# Patient Record
Sex: Female | Born: 1960 | Race: White | Hispanic: No | Marital: Married
Health system: Southern US, Community
[De-identification: ages and names within clinical notes are randomized; demographics above are authoritative.]

## PROBLEM LIST (undated history)

## (undated) DIAGNOSIS — J189 Pneumonia, unspecified organism: Secondary | ICD-10-CM

## (undated) DIAGNOSIS — J80 Acute respiratory distress syndrome: Secondary | ICD-10-CM

## (undated) DIAGNOSIS — U071 COVID-19: Secondary | ICD-10-CM

## (undated) DIAGNOSIS — J9621 Acute and chronic respiratory failure with hypoxia: Secondary | ICD-10-CM

## (undated) DIAGNOSIS — I5032 Chronic diastolic (congestive) heart failure: Secondary | ICD-10-CM

---

## 2019-06-25 ENCOUNTER — Inpatient Hospital Stay
Admission: EM | Admit: 2019-06-25 | Discharge: 2019-07-09 | Disposition: A | Discharge status: Home / Self Care | Payer: BC Managed Care – PPO | Attending: Internal Medicine | Admitting: Internal Medicine

## 2019-06-25 ENCOUNTER — Other Ambulatory Visit (HOSPITAL_COMMUNITY): Payer: Self-pay

## 2019-06-25 DIAGNOSIS — J189 Pneumonia, unspecified organism: Secondary | ICD-10-CM | POA: Diagnosis present

## 2019-06-25 DIAGNOSIS — J9621 Acute and chronic respiratory failure with hypoxia: Secondary | ICD-10-CM | POA: Diagnosis present

## 2019-06-25 DIAGNOSIS — U071 COVID-19: Secondary | ICD-10-CM | POA: Diagnosis present

## 2019-06-25 DIAGNOSIS — I5032 Chronic diastolic (congestive) heart failure: Secondary | ICD-10-CM | POA: Diagnosis present

## 2019-06-25 DIAGNOSIS — J80 Acute respiratory distress syndrome: Secondary | ICD-10-CM | POA: Diagnosis present

## 2019-06-25 DIAGNOSIS — J9690 Respiratory failure, unspecified, unspecified whether with hypoxia or hypercapnia: Secondary | ICD-10-CM

## 2019-06-25 HISTORY — DX: COVID-19: U07.1

## 2019-06-25 HISTORY — DX: Morbid (severe) obesity due to excess calories: E66.01

## 2019-06-25 HISTORY — DX: Acute and chronic respiratory failure with hypoxia: J96.21

## 2019-06-25 HISTORY — DX: Acute respiratory distress syndrome: J80

## 2019-06-25 HISTORY — DX: Chronic diastolic (congestive) heart failure: I50.32

## 2019-06-25 HISTORY — DX: Pneumonia, unspecified organism: J18.9

## 2019-06-25 MED ORDER — PHENYLEPHRINE-MINERAL OIL-PET 0.25-14-74.9 % RE OINT
TOPICAL_OINTMENT | RECTAL | Status: DC
Start: ? — End: 2019-06-25

## 2019-06-25 MED ORDER — ALBUTEROL SULFATE HFA 108 (90 BASE) MCG/ACT IN AERS
8.00 | INHALATION_SPRAY | RESPIRATORY_TRACT | Status: DC
Start: ? — End: 2019-06-25

## 2019-06-25 MED ORDER — INSULIN LISPRO 100 UNIT/ML ~~LOC~~ SOLN
1.00 | SUBCUTANEOUS | Status: DC
Start: 2019-06-26 — End: 2019-06-25

## 2019-06-25 MED ORDER — ARFORMOTEROL TARTRATE 15 MCG/2ML IN NEBU
15.00 | INHALATION_SOLUTION | RESPIRATORY_TRACT | Status: DC
Start: 2019-06-25 — End: 2019-06-25

## 2019-06-25 MED ORDER — DEXTROSE 10 % IV SOLN
125.00 | INTRAVENOUS | Status: DC
Start: ? — End: 2019-06-25

## 2019-06-25 MED ORDER — LIDOCAINE 4 % EX PTCH
3.00 | MEDICATED_PATCH | CUTANEOUS | Status: DC
Start: ? — End: 2019-06-25

## 2019-06-25 MED ORDER — GENERIC EXTERNAL MEDICATION
137.00 | Status: DC
Start: 2019-06-26 — End: 2019-06-25

## 2019-06-25 MED ORDER — BENZONATATE 100 MG PO CAPS
100.00 | ORAL_CAPSULE | ORAL | Status: DC
Start: ? — End: 2019-06-25

## 2019-06-25 MED ORDER — GUAIFENESIN ER 600 MG PO TB12
600.00 | ORAL_TABLET | ORAL | Status: DC
Start: 2019-06-25 — End: 2019-06-25

## 2019-06-25 MED ORDER — BUDESONIDE 0.5 MG/2ML IN SUSP
0.50 | RESPIRATORY_TRACT | Status: DC
Start: 2019-06-25 — End: 2019-06-25

## 2019-06-25 MED ORDER — DOCUSATE SODIUM 283 MG RE ENEM
283.00 | ENEMA | RECTAL | Status: DC
Start: ? — End: 2019-06-25

## 2019-06-25 MED ORDER — SALINE NASAL SPRAY 0.65 % NA SOLN
1.00 | NASAL | Status: DC
Start: ? — End: 2019-06-25

## 2019-06-25 MED ORDER — INSULIN LISPRO 100 UNIT/ML ~~LOC~~ SOLN
2.00 | SUBCUTANEOUS | Status: DC
Start: 2019-06-26 — End: 2019-06-25

## 2019-06-25 MED ORDER — POLYETHYLENE GLYCOL 3350 17 GM/SCOOP PO POWD
17.00 | ORAL | Status: DC
Start: 2019-06-25 — End: 2019-06-25

## 2019-06-25 MED ORDER — GENERIC EXTERNAL MEDICATION
80.00 | Status: DC
Start: 2019-06-26 — End: 2019-06-25

## 2019-06-25 MED ORDER — ATORVASTATIN CALCIUM 40 MG PO TABS
40.00 | ORAL_TABLET | ORAL | Status: DC
Start: 2019-06-26 — End: 2019-06-25

## 2019-06-25 MED ORDER — ACETAMINOPHEN 325 MG PO TABS
650.00 | ORAL_TABLET | ORAL | Status: DC
Start: ? — End: 2019-06-25

## 2019-06-25 MED ORDER — ASPIRIN 81 MG PO TBEC
81.00 | DELAYED_RELEASE_TABLET | ORAL | Status: DC
Start: 2019-06-26 — End: 2019-06-25

## 2019-06-25 MED ORDER — ENOXAPARIN SODIUM 40 MG/0.4ML ~~LOC~~ SOLN
40.00 | SUBCUTANEOUS | Status: DC
Start: 2019-06-26 — End: 2019-06-25

## 2019-06-25 MED ORDER — HYDROXYZINE HCL 25 MG PO TABS
25.00 | ORAL_TABLET | ORAL | Status: DC
Start: ? — End: 2019-06-25

## 2019-06-25 MED ORDER — PANTOPRAZOLE SODIUM 40 MG PO TBEC
40.00 | DELAYED_RELEASE_TABLET | ORAL | Status: DC
Start: 2019-06-26 — End: 2019-06-25

## 2019-06-25 MED ORDER — GENERIC EXTERNAL MEDICATION
2.50 | Status: DC
Start: ? — End: 2019-06-25

## 2019-06-25 MED ORDER — GLUCOSE 40 % PO GEL
15.00 | ORAL | Status: DC
Start: ? — End: 2019-06-25

## 2019-06-25 MED ORDER — MELATONIN 3 MG PO TABS
6.00 | ORAL_TABLET | ORAL | Status: DC
Start: ? — End: 2019-06-25

## 2019-06-25 MED ORDER — SENNOSIDES-DOCUSATE SODIUM 8.6-50 MG PO TABS
2.00 | ORAL_TABLET | ORAL | Status: DC
Start: 2019-06-25 — End: 2019-06-25

## 2019-06-26 LAB — URINALYSIS, ROUTINE W REFLEX MICROSCOPIC
Bilirubin Urine: NEGATIVE
Glucose, UA: NEGATIVE mg/dL
Hgb urine dipstick: NEGATIVE
Ketones, ur: NEGATIVE mg/dL
Nitrite: NEGATIVE
Protein, ur: NEGATIVE mg/dL
Specific Gravity, Urine: 1.019 (ref 1.005–1.030)
pH: 5 (ref 5.0–8.0)

## 2019-06-26 LAB — CBC
HCT: 38.7 % (ref 36.0–46.0)
Hemoglobin: 12.4 g/dL (ref 12.0–15.0)
MCH: 31.6 pg (ref 26.0–34.0)
MCHC: 32 g/dL (ref 30.0–36.0)
MCV: 98.7 fL (ref 80.0–100.0)
Platelets: 313 10*3/uL (ref 150–400)
RBC: 3.92 MIL/uL (ref 3.87–5.11)
RDW: 15.1 % (ref 11.5–15.5)
WBC: 9.9 10*3/uL (ref 4.0–10.5)
nRBC: 0 % (ref 0.0–0.2)

## 2019-06-26 LAB — COMPREHENSIVE METABOLIC PANEL
ALT: 22 U/L (ref 0–44)
AST: 24 U/L (ref 15–41)
Albumin: 3.4 g/dL — ABNORMAL LOW (ref 3.5–5.0)
Alkaline Phosphatase: 80 U/L (ref 38–126)
Anion gap: 14 (ref 5–15)
BUN: 15 mg/dL (ref 6–20)
CO2: 32 mmol/L (ref 22–32)
Calcium: 10.7 mg/dL — ABNORMAL HIGH (ref 8.9–10.3)
Chloride: 90 mmol/L — ABNORMAL LOW (ref 98–111)
Creatinine, Ser: 0.93 mg/dL (ref 0.44–1.00)
GFR calc Af Amer: >60 mL/min (ref >60)
GFR calc non Af Amer: >60 mL/min (ref >60)
Glucose, Bld: 129 mg/dL — ABNORMAL HIGH (ref 70–99)
Potassium: 3.4 mmol/L — ABNORMAL LOW (ref 3.5–5.1)
Sodium: 136 mmol/L (ref 135–145)
Total Bilirubin: 0.9 mg/dL (ref 0.3–1.2)
Total Protein: 7.4 g/dL (ref 6.5–8.1)

## 2019-06-26 LAB — PROTIME-INR
INR: 1 (ref 0.8–1.2)
Prothrombin Time: 12.6 seconds (ref 11.4–15.2)

## 2019-06-26 LAB — APTT: aPTT: 36 seconds (ref 24–36)

## 2019-06-26 LAB — TSH: TSH: 8.53 u[IU]/mL — ABNORMAL HIGH (ref 0.350–4.500)

## 2019-06-27 ENCOUNTER — Encounter: Payer: Self-pay | Admitting: Internal Medicine

## 2019-06-27 DIAGNOSIS — J9621 Acute and chronic respiratory failure with hypoxia: Secondary | ICD-10-CM | POA: Diagnosis not present

## 2019-06-27 DIAGNOSIS — U071 COVID-19: Secondary | ICD-10-CM

## 2019-06-27 DIAGNOSIS — J80 Acute respiratory distress syndrome: Secondary | ICD-10-CM | POA: Diagnosis present

## 2019-06-27 DIAGNOSIS — I5032 Chronic diastolic (congestive) heart failure: Secondary | ICD-10-CM | POA: Diagnosis not present

## 2019-06-27 DIAGNOSIS — J189 Pneumonia, unspecified organism: Secondary | ICD-10-CM | POA: Diagnosis not present

## 2019-06-27 LAB — POTASSIUM: Potassium: 4.2 mmol/L (ref 3.5–5.1)

## 2019-06-27 NOTE — Consult Note (Signed)
[facility] [place]  Date of Service: 06/27/2019  PULMONARY CRITICAL CARE CONSULT   Kathy Torres  LTJ:[id number]  DOB: [birth year]   DOA: 06/25/2019  Referring Physician: Merton Border, MD  HPI: Kathy Torres is a 59 y.o. female seen for follow up of Acute on Chronic Respiratory Failure.  Patient has multiple medical problems including hypertension hyperlipidemia hypothyroidism morbid obesity presented to the hospital because of increasing shortness of breath was found to be COVID-19 positive initially was started on 12 L nasal cannula however had pretty quick desaturations noted.  Patient was started on remdesivir dexamethasone for full course.  Was requiring OptiFlow in the MICU but apparently did not get intubated according to the notes.  She also was diagnosed with healthcare associated pneumonia secondary to Pseudomonas which patient was treated.  Echocardiogram did not show any abnormalities.  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  Patient also had ARDS noted.  Despite not requiring intubation she has had high oxygen requirements transferred to our facility for further management and weaning.  Review of Systems:  ROS performed and is unremarkable other than noted above.  Past Medical History: Past Medical History:  Diagnosis Date  . Diverticulitis  . Hypertension  . Renal disorder  . UTI (urinary tract infection)   Social History: Social History   Socioeconomic History  . Marital status: Married  Spouse name: Not on file  . Number of children: Not on file  . Years of education: Not on file  . Highest education level: Not on file  Occupational History  . Not on file  Social Needs  . Financial resource strain: Not on file  . Food insecurity  Worry: Not on file  Inability: Not on file  . Transportation needs  Medical: Not on file  Non-medical: Not on file   Tobacco Use  . Smoking status: Never Smoker  . Smokeless tobacco: Never Used  Substance and Sexual Activity  . Alcohol use: No  . Drug use: No   Allergies: Allergies  Allergen Reactions  . Toradol [Ketorolac] Other (See Comments)  Kidney failure  . Amoxicillin Rash (ALLERGY/intolerance)  . Shrimp Rash (ALLERGY/intolerance)      Medications: Reviewed on Rounds  Physical Exam:  Vitals: Temperature 97.1 pulse 100 respiratory 24 blood pressure is 149/70 saturations 92%  Ventilator Settings on 6 L but has been needing up to 15 L on Oxymizer off the ventilator right now  . General: Comfortable at this time . Eyes: Grossly normal lids, irises & conjunctiva . ENT: grossly tongue is normal . Neck: no obvious mass . Cardiovascular: S1-S2 normal no gallop or rub . Respiratory: Coarse rhonchi noted bilaterally . Abdomen: Soft and nontender . Skin: no rash seen on limited exam . Musculoskeletal: not rigid . Psychiatric:unable to assess . Neurologic: no seizure no involuntary movements         Labs on Admission:  Basic Metabolic Panel: Recent Labs  Lab 06/26/19 0818 06/27/19 0459  NA 136  --   K 3.4* 4.2  CL 90*  --   CO2 32  --   GLUCOSE 129*  --   BUN 15  --   CREATININE 0.93  --   CALCIUM 10.7*  --     No results for input(s): PHART, PCO2ART, PO2ART, HCO3, O2SAT in the last 168 hours.  Liver Function Tests: Recent Labs  Lab 06/26/19 0818  AST 24  ALT 22  ALKPHOS 80  BILITOT 0.9  PROT 7.4  ALBUMIN 3.4*   No results for input(s): LIPASE, AMYLASE in the last 168 hours. No results for input(s): AMMONIA in the last 168 hours.  CBC: Recent Labs  Lab 06/26/19 0818  WBC 9.9  HGB 12.4  HCT 38.7  MCV 98.7  PLT 313    Cardiac Enzymes: No results for input(s): CKTOTAL, CKMB, CKMBINDEX, TROPONINI in the last 168 hours.  BNP (last 3 results) No results for input(s): BNP in the last 8760 hours.  ProBNP (last 3 results) No results for input(s): PROBNP  in the last 8760 hours.   Radiological Exams on Admission: DG Chest Port 1 View  Result Date: 06/25/2019 CLINICAL DATA:  Respiratory failure EXAM: PORTABLE CHEST 1 VIEW COMPARISON:  None. FINDINGS: Cardiomegaly. Diffuse bilateral airspace disease. Low lung volumes. No effusions or acute bony abnormality. IMPRESSION: Diffuse bilateral airspace disease with low volumes. This could reflect edema or infection. Electronically Signed   By: Charlett Nose M.D.   On: 06/25/2019 21:23    Assessment/Plan Active Problems:   Acute on chronic respiratory failure with hypoxia (HCC)   COVID-19 virus infection   Acute respiratory distress syndrome (ARDS) due to COVID-19 virus (HCC)   Healthcare-associated pneumonia   Chronic diastolic heart failure (HCC)   Morbid obesity due to excess calories (HCC)   1. Acute on chronic respiratory failure with hypoxia as she does have residual damage from the COVID-19 with diffuse bilateral airspace disease and low lung volumes.  The patient is now on oxygen however has been requiring higher flow rates from 6 to 15 L.  We will try to wean this down. 2. COVID-19 virus infection in resolution phase we will continue to monitor 3. ARDS secondary to COVID-19 patient has had slow improvement still requiring significant elevation in oxygen requirements. 4. Healthcare associated pneumonia this has been treated we will continue to monitor closely. 5. Morbid obesity dietary management 6. Diastolic heart failure with preserved ejection fraction we will continue with supportive care monitor fluid status rather closely.  I have personally seen and evaluated the patient, evaluated laboratory and imaging results, formulated the assessment and plan and placed orders. The Patient requires high complexity decision making with multiple systems involvement.  Case was discussed on Rounds with the Respiratory Therapy Director and the Respiratory staff Time Spent  Yevonne Pax, MD  [facility] Pulmonary Critical Care Medicine Sleep Medicine

## 2019-06-28 DIAGNOSIS — J9621 Acute and chronic respiratory failure with hypoxia: Secondary | ICD-10-CM | POA: Diagnosis not present

## 2019-06-28 DIAGNOSIS — I5032 Chronic diastolic (congestive) heart failure: Secondary | ICD-10-CM | POA: Diagnosis not present

## 2019-06-28 DIAGNOSIS — U071 COVID-19: Secondary | ICD-10-CM | POA: Diagnosis not present

## 2019-06-28 DIAGNOSIS — J189 Pneumonia, unspecified organism: Secondary | ICD-10-CM | POA: Diagnosis not present

## 2019-06-28 LAB — URINE CULTURE: Culture: >=100000 — AB

## 2019-06-28 NOTE — Progress Notes (Addendum)
Pulmonary Critical Care Medicine [facility] [place]   PULMONARY CRITICAL CARE SERVICE  PROGRESS NOTE  Date of Service: 06/28/2019  Kathy Torres  [id number]  DOB: 1960/11/01   DOA: 06/25/2019  Referring Physician: Carron Curie, MD  HPI: Kathy Torres is a 59 y.o. female seen for follow up of Acute on Chronic Respiratory Failure.  Patient remains on 7 L Oxymizer satting well no distress  Medications: Reviewed on Rounds  Physical Exam:  Vitals: Pulse 72 respirations 20 BP 128/71 O2 sat 99% temp 97.2  Ventilator Settings 7 L Oxymizer  . General: Comfortable at this time . Eyes: Grossly normal lids, irises & conjunctiva . ENT: grossly tongue is normal . Neck: no obvious mass . Cardiovascular: S1 S2 normal no gallop . Respiratory: No rales rhonchi noted . Abdomen: soft . Skin: no rash seen on limited exam . Musculoskeletal: not rigid . Psychiatric:unable to assess . Neurologic: no seizure no involuntary movements         Lab Data:   Basic Metabolic Panel: Recent Labs  Lab 06/26/19 0818 06/27/19 0459  NA 136  --   K 3.4* 4.2  CL 90*  --   CO2 32  --   GLUCOSE 129*  --   BUN 15  --   CREATININE 0.93  --   CALCIUM 10.7*  --     ABG: No results for input(s): PHART, PCO2ART, PO2ART, HCO3, O2SAT in the last 168 hours.  Liver Function Tests: Recent Labs  Lab 06/26/19 0818  AST 24  ALT 22  ALKPHOS 80  BILITOT 0.9  PROT 7.4  ALBUMIN 3.4*   No results for input(s): LIPASE, AMYLASE in the last 168 hours. No results for input(s): AMMONIA in the last 168 hours.  CBC: Recent Labs  Lab 06/26/19 0818  WBC 9.9  HGB 12.4  HCT 38.7  MCV 98.7  PLT 313    Cardiac Enzymes: No results for input(s): CKTOTAL, CKMB, CKMBINDEX, TROPONINI in the last 168 hours.  BNP (last 3 results) No results for input(s): BNP in the last 8760 hours.  ProBNP (last 3 results) No results for input(s): PROBNP in the last 8760 hours.  Radiological Exams: No results  found.  Assessment/Plan Active Problems:   Acute on chronic respiratory failure with hypoxia (HCC)   COVID-19 virus infection   Acute respiratory distress syndrome (ARDS) due to COVID-19 virus (HCC)   Healthcare-associated pneumonia   Chronic diastolic heart failure (HCC)   Morbid obesity due to excess calories (HCC)   1. Acute on chronic respiratory failure with hypoxia patient continues on 7 L Oxymizer unable to wean any further at this time satting well continue aggressive pulmonary toilet supportive measures. 2. COVID-19 virus infection in resolution phase we will continue to monitor 3. ARDS secondary to COVID-19 patient has had slow improvement still requiring significant elevation in oxygen requirements. 4. Healthcare associated pneumonia this has been treated we will continue to monitor closely. 5. Morbid obesity dietary management 6. Diastolic heart failure with preserved ejection fraction we will continue with supportive care monitor fluid status rather closely.   I have personally seen and evaluated the patient, evaluated laboratory and imaging results, formulated the assessment and plan and placed orders. The Patient requires high complexity decision making with multiple systems involvement.  Rounds were done with the Respiratory Therapy Director and Staff therapists and discussed with nursing staff also.  Yevonne Pax, MD [facility] Pulmonary Critical Care Medicine Sleep Medicine

## 2019-06-29 DIAGNOSIS — I5032 Chronic diastolic (congestive) heart failure: Secondary | ICD-10-CM | POA: Diagnosis not present

## 2019-06-29 DIAGNOSIS — J189 Pneumonia, unspecified organism: Secondary | ICD-10-CM | POA: Diagnosis not present

## 2019-06-29 DIAGNOSIS — J9621 Acute and chronic respiratory failure with hypoxia: Secondary | ICD-10-CM | POA: Diagnosis not present

## 2019-06-29 DIAGNOSIS — U071 COVID-19: Secondary | ICD-10-CM | POA: Diagnosis not present

## 2019-06-29 LAB — URINE CULTURE: Culture: >=100000 — AB

## 2019-06-29 NOTE — Progress Notes (Addendum)
Pulmonary Critical Care Medicine [facility] [place]   PULMONARY CRITICAL CARE SERVICE  PROGRESS NOTE  Date of Service: 06/29/2019  Kathy Torres  [id number]  DOB: 02/17/1961   DOA: 06/25/2019  Referring Physician: Carron Curie, MD  HPI: Kathy Torres is a 59 y.o. female seen for follow up of Acute on Chronic Respiratory Failure.  Patient Oxymizer satting well no fever or distress.  Medications: Reviewed on Rounds  Physical Exam:  Vitals: Pulse 75 respirations 18 BP 114/71 O2 sat 95% temp 97.1  Ventilator Settings 7 L Oxymizer  . General: Comfortable at this time . Eyes: Grossly normal lids, irises & conjunctiva . ENT: grossly tongue is normal . Neck: no obvious mass . Cardiovascular: S1 S2 normal no gallop . Respiratory: No rales or rhonchi noted . Abdomen: soft . Skin: no rash seen on limited exam . Musculoskeletal: not rigid . Psychiatric:unable to assess . Neurologic: no seizure no involuntary movements         Lab Data:   Basic Metabolic Panel: Recent Labs  Lab 06/26/19 0818 06/27/19 0459  NA 136  --   K 3.4* 4.2  CL 90*  --   CO2 32  --   GLUCOSE 129*  --   BUN 15  --   CREATININE 0.93  --   CALCIUM 10.7*  --     ABG: No results for input(s): PHART, PCO2ART, PO2ART, HCO3, O2SAT in the last 168 hours.  Liver Function Tests: Recent Labs  Lab 06/26/19 0818  AST 24  ALT 22  ALKPHOS 80  BILITOT 0.9  PROT 7.4  ALBUMIN 3.4*   No results for input(s): LIPASE, AMYLASE in the last 168 hours. No results for input(s): AMMONIA in the last 168 hours.  CBC: Recent Labs  Lab 06/26/19 0818  WBC 9.9  HGB 12.4  HCT 38.7  MCV 98.7  PLT 313    Cardiac Enzymes: No results for input(s): CKTOTAL, CKMB, CKMBINDEX, TROPONINI in the last 168 hours.  BNP (last 3 results) No results for input(s): BNP in the last 8760 hours.  ProBNP (last 3 results) No results for input(s): PROBNP in the last 8760 hours.  Radiological Exams: No results  found.  Assessment/Plan Active Problems:   Acute on chronic respiratory failure with hypoxia (HCC)   COVID-19 virus infection   Acute respiratory distress syndrome (ARDS) due to COVID-19 virus (HCC)   Healthcare-associated pneumonia   Chronic diastolic heart failure (HCC)   Morbid obesity due to excess calories (HCC)   1. Acute on chronic respiratory failure with hypoxia continue to wean down Oxymizer.  Currently on 7 L.  Continue supportive measures and pulmonary toilet. 2. COVID-19 virus infection in resolution phase we will continue to monitor 3. ARDS secondary to COVID-19 patient has had slow improvement still requiring significant elevation in oxygen requirements. 4. Healthcare associated pneumonia this has been treated we will continue to monitor closely. 5. Morbid obesity dietary management 6. Diastolic heart failure with preserved ejection fraction we will continue with supportive care monitor fluid status rather closely.   I have personally seen and evaluated the patient, evaluated laboratory and imaging results, formulated the assessment and plan and placed orders. The Patient requires high complexity decision making with multiple systems involvement.  Rounds were done with the Respiratory Therapy Director and Staff therapists and discussed with nursing staff also.  Yevonne Pax, MD [facility] Pulmonary Critical Care Medicine Sleep Medicine

## 2019-06-30 DIAGNOSIS — J189 Pneumonia, unspecified organism: Secondary | ICD-10-CM | POA: Diagnosis not present

## 2019-06-30 DIAGNOSIS — I5032 Chronic diastolic (congestive) heart failure: Secondary | ICD-10-CM | POA: Diagnosis not present

## 2019-06-30 DIAGNOSIS — U071 COVID-19: Secondary | ICD-10-CM | POA: Diagnosis not present

## 2019-06-30 DIAGNOSIS — J9621 Acute and chronic respiratory failure with hypoxia: Secondary | ICD-10-CM | POA: Diagnosis not present

## 2019-06-30 LAB — MAGNESIUM: Magnesium: 1.9 mg/dL (ref 1.7–2.4)

## 2019-06-30 LAB — CBC
HCT: 36.7 % (ref 36.0–46.0)
Hemoglobin: 11.9 g/dL — ABNORMAL LOW (ref 12.0–15.0)
MCH: 32.4 pg (ref 26.0–34.0)
MCHC: 32.4 g/dL (ref 30.0–36.0)
MCV: 100 fL (ref 80.0–100.0)
Platelets: 374 10*3/uL (ref 150–400)
RBC: 3.67 MIL/uL — ABNORMAL LOW (ref 3.87–5.11)
RDW: 15.1 % (ref 11.5–15.5)
WBC: 14.6 10*3/uL — ABNORMAL HIGH (ref 4.0–10.5)
nRBC: 0 % (ref 0.0–0.2)

## 2019-06-30 LAB — BASIC METABOLIC PANEL
Anion gap: 10 (ref 5–15)
BUN: 23 mg/dL — ABNORMAL HIGH (ref 6–20)
CO2: 33 mmol/L — ABNORMAL HIGH (ref 22–32)
Calcium: 10.3 mg/dL (ref 8.9–10.3)
Chloride: 94 mmol/L — ABNORMAL LOW (ref 98–111)
Creatinine, Ser: 0.89 mg/dL (ref 0.44–1.00)
GFR calc Af Amer: >60 mL/min (ref >60)
GFR calc non Af Amer: >60 mL/min (ref >60)
Glucose, Bld: 114 mg/dL — ABNORMAL HIGH (ref 70–99)
Potassium: 3.2 mmol/L — ABNORMAL LOW (ref 3.5–5.1)
Sodium: 137 mmol/L (ref 135–145)

## 2019-06-30 NOTE — Progress Notes (Addendum)
Pulmonary Critical Care Medicine [facility] [place]   PULMONARY CRITICAL CARE SERVICE  PROGRESS NOTE  Date of Service: 06/30/2019  Ketsia Linebaugh  PFX:[id number]  DOB: 22-Mar-1961   DOA: 06/25/2019  Referring Physician: Carron Curie, MD  HPI: [place] Lemaster is a 59 y.o. female seen for follow up of Acute on Chronic Respiratory Failure.  Patient is on 5 L Oxymizer satting well no fever distress at this time.  Medications: Reviewed on Rounds  Physical Exam:  Vitals: Pulse 70 respirations 18 BP 122/81 O2 sat 95% temp 96.5  Ventilator Settings 5 L Oxymizer  . General: Comfortable at this time . Eyes: Grossly normal lids, irises & conjunctiva . ENT: grossly tongue is normal . Neck: no obvious mass . Cardiovascular: S1 S2 normal no gallop . Respiratory: No rales or rhonchi noted . Abdomen: soft . Skin: no rash seen on limited exam . Musculoskeletal: not rigid . Psychiatric:unable to assess . Neurologic: no seizure no involuntary movements         Lab Data:   Basic Metabolic Panel: Recent Labs  Lab 06/26/19 0818 06/27/19 0459 06/30/19 0741  NA 136  --  137  K 3.4* 4.2 3.2*  CL 90*  --  94*  CO2 32  --  33*  GLUCOSE 129*  --  114*  BUN 15  --  23*  CREATININE 0.93  --  0.89  CALCIUM 10.7*  --  10.3  MG  --   --  1.9    ABG: No results for input(s): PHART, PCO2ART, PO2ART, HCO3, O2SAT in the last 168 hours.  Liver Function Tests: Recent Labs  Lab 06/26/19 0818  AST 24  ALT 22  ALKPHOS 80  BILITOT 0.9  PROT 7.4  ALBUMIN 3.4*   No results for input(s): LIPASE, AMYLASE in the last 168 hours. No results for input(s): AMMONIA in the last 168 hours.  CBC: Recent Labs  Lab 06/26/19 0818 06/30/19 0741  WBC 9.9 14.6*  HGB 12.4 11.9*  HCT 38.7 36.7  MCV 98.7 100.0  PLT 313 374    Cardiac Enzymes: No results for input(s): CKTOTAL, CKMB, CKMBINDEX, TROPONINI in the last 168 hours.  BNP (last 3 results) No results for input(s): BNP in the last  8760 hours.  ProBNP (last 3 results) No results for input(s): PROBNP in the last 8760 hours.  Radiological Exams: No results found.  Assessment/Plan Active Problems:   Acute on chronic respiratory failure with hypoxia (HCC)   COVID-19 virus infection   Acute respiratory distress syndrome (ARDS) due to COVID-19 virus (HCC)   Healthcare-associated pneumonia   Chronic diastolic heart failure (HCC)   Morbid obesity due to excess calories (HCC)   1. Acute on chronic respiratory failure with hypoxia continue to wean down Oxymizer.  Currently on 5 L.  Continue supportive measures and pulmonary toilet. 2. COVID-19 virus infection in resolution phase we will continue to monitor 3. ARDS secondary to COVID-19 patient has had slow improvement still requiring significant elevation in oxygen requirements. 4. Healthcare associated pneumonia this has been treated we will continue to monitor closely. 5. Morbid obesity dietary management 6. Diastolic heart failure with preserved ejection fraction we will continue with supportive care monitor fluid status rather closely.   I have personally seen and evaluated the patient, evaluated laboratory and imaging results, formulated the assessment and plan and placed orders. The Patient requires high complexity decision making with multiple systems involvement.  Rounds were done with the Respiratory Therapy Director and Staff therapists  and discussed with nursing staff also.  Allyne Gee, MD [facility] Pulmonary Critical Care Medicine Sleep Medicine

## 2019-07-01 DIAGNOSIS — J189 Pneumonia, unspecified organism: Secondary | ICD-10-CM | POA: Diagnosis not present

## 2019-07-01 DIAGNOSIS — J9621 Acute and chronic respiratory failure with hypoxia: Secondary | ICD-10-CM | POA: Diagnosis not present

## 2019-07-01 DIAGNOSIS — U071 COVID-19: Secondary | ICD-10-CM | POA: Diagnosis not present

## 2019-07-01 DIAGNOSIS — I5032 Chronic diastolic (congestive) heart failure: Secondary | ICD-10-CM | POA: Diagnosis not present

## 2019-07-01 LAB — POTASSIUM: Potassium: 3.7 mmol/L (ref 3.5–5.1)

## 2019-07-01 NOTE — Progress Notes (Signed)
Pulmonary Critical Care Medicine [facility] [place]   PULMONARY CRITICAL CARE SERVICE  PROGRESS NOTE  Date of Service: 07/01/2019  Zurisadai Helminiak  BJS:[id number]  DOB: 1961/02/06   DOA: 06/25/2019  Referring Physician: Carron Curie, MD  HPI: Chele Cornell is a 59 y.o. female seen for follow up of Acute on Chronic Respiratory Failure.  Patient is on the Oxymizer right now is on 4 L trying to wean FiO2 down which is slowly progressing  Medications: Reviewed on Rounds  Physical Exam:  Vitals: Temperature 99.5 pulse 65 respiratory rate 12 blood pressure is 108/73 saturations 94%  Ventilator Settings off the ventilator on the Oxymizer  . General: Comfortable at this time . Eyes: Grossly normal lids, irises & conjunctiva . ENT: grossly tongue is normal . Neck: no obvious mass . Cardiovascular: S1 S2 normal no gallop . Respiratory: No rhonchi no rales are noted at this time . Abdomen: soft . Skin: no rash seen on limited exam . Musculoskeletal: not rigid . Psychiatric:unable to assess . Neurologic: no seizure no involuntary movements         Lab Data:   Basic Metabolic Panel: Recent Labs  Lab 06/26/19 0818 06/27/19 0459 06/30/19 0741 07/01/19 0709  NA 136  --  137  --   K 3.4* 4.2 3.2* 3.7  CL 90*  --  94*  --   CO2 32  --  33*  --   GLUCOSE 129*  --  114*  --   BUN 15  --  23*  --   CREATININE 0.93  --  0.89  --   CALCIUM 10.7*  --  10.3  --   MG  --   --  1.9  --     ABG: No results for input(s): PHART, PCO2ART, PO2ART, HCO3, O2SAT in the last 168 hours.  Liver Function Tests: Recent Labs  Lab 06/26/19 0818  AST 24  ALT 22  ALKPHOS 80  BILITOT 0.9  PROT 7.4  ALBUMIN 3.4*   No results for input(s): LIPASE, AMYLASE in the last 168 hours. No results for input(s): AMMONIA in the last 168 hours.  CBC: Recent Labs  Lab 06/26/19 0818 06/30/19 0741  WBC 9.9 14.6*  HGB 12.4 11.9*  HCT 38.7 36.7  MCV 98.7 100.0  PLT 313 374    Cardiac  Enzymes: No results for input(s): CKTOTAL, CKMB, CKMBINDEX, TROPONINI in the last 168 hours.  BNP (last 3 results) No results for input(s): BNP in the last 8760 hours.  ProBNP (last 3 results) No results for input(s): PROBNP in the last 8760 hours.  Radiological Exams: No results found.  Assessment/Plan Active Problems:   Acute on chronic respiratory failure with hypoxia (HCC)   COVID-19 virus infection   Acute respiratory distress syndrome (ARDS) due to COVID-19 virus (HCC)   Healthcare-associated pneumonia   Chronic diastolic heart failure (HCC)   Morbid obesity due to excess calories (HCC)   1. Acute on chronic respiratory failure with hypoxia patient still is on 4 L Oxymizer plan is to continue to wean this down as tolerated. 2. COVID-19 virus infection resolving we will continue to monitor 3. ARDS slow to improve 4. Healthcare associated pneumonia treated clinically improving 5. Chronic diastolic heart failure peers to be compensated 6. Morbid obesity we will continue with present management   I have personally seen and evaluated the patient, evaluated laboratory and imaging results, formulated the assessment and plan and placed orders. The Patient requires high complexity decision making with  multiple systems involvement.  Rounds were done with the Respiratory Therapy Director and Staff therapists and discussed with nursing staff also.  Allyne Gee, MD [facility] Pulmonary Critical Care Medicine Sleep Medicine

## 2019-07-02 DIAGNOSIS — U071 COVID-19: Secondary | ICD-10-CM | POA: Diagnosis not present

## 2019-07-02 DIAGNOSIS — J9621 Acute and chronic respiratory failure with hypoxia: Secondary | ICD-10-CM | POA: Diagnosis not present

## 2019-07-02 DIAGNOSIS — I5032 Chronic diastolic (congestive) heart failure: Secondary | ICD-10-CM | POA: Diagnosis not present

## 2019-07-02 DIAGNOSIS — J189 Pneumonia, unspecified organism: Secondary | ICD-10-CM | POA: Diagnosis not present

## 2019-07-02 LAB — CBC
HCT: 37.1 % (ref 36.0–46.0)
Hemoglobin: 12 g/dL (ref 12.0–15.0)
MCH: 31.8 pg (ref 26.0–34.0)
MCHC: 32.3 g/dL (ref 30.0–36.0)
MCV: 98.4 fL (ref 80.0–100.0)
Platelets: 335 10*3/uL (ref 150–400)
RBC: 3.77 MIL/uL — ABNORMAL LOW (ref 3.87–5.11)
RDW: 15 % (ref 11.5–15.5)
WBC: 14.3 10*3/uL — ABNORMAL HIGH (ref 4.0–10.5)
nRBC: 0 % (ref 0.0–0.2)

## 2019-07-02 LAB — BASIC METABOLIC PANEL
Anion gap: 11 (ref 5–15)
BUN: 20 mg/dL (ref 6–20)
CO2: 33 mmol/L — ABNORMAL HIGH (ref 22–32)
Calcium: 10.3 mg/dL (ref 8.9–10.3)
Chloride: 92 mmol/L — ABNORMAL LOW (ref 98–111)
Creatinine, Ser: 0.82 mg/dL (ref 0.44–1.00)
GFR calc Af Amer: >60 mL/min (ref >60)
GFR calc non Af Amer: >60 mL/min (ref >60)
Glucose, Bld: 126 mg/dL — ABNORMAL HIGH (ref 70–99)
Potassium: 3.4 mmol/L — ABNORMAL LOW (ref 3.5–5.1)
Sodium: 136 mmol/L (ref 135–145)

## 2019-07-02 LAB — PHOSPHORUS: Phosphorus: 3 mg/dL (ref 2.5–4.6)

## 2019-07-02 LAB — MAGNESIUM: Magnesium: 2.1 mg/dL (ref 1.7–2.4)

## 2019-07-02 NOTE — Progress Notes (Signed)
Pulmonary Critical Care Medicine [facility] [place]   PULMONARY CRITICAL CARE SERVICE  PROGRESS NOTE  Date of Service: 07/02/2019  Kathy Torres  [id number]  DOB: [birth year]   DOA: 06/25/2019  Referring Physician: Carron Curie, MD  HPI: Kathy Torres is a 59 y.o. female seen for follow up of Acute on Chronic Respiratory Failure.  Patient has been on 4 L Oxymizer doing well oxygenation is improving as well as requirements for oxygen are improving.   Medications: Reviewed on Rounds  Physical Exam:  Vitals: Temperature is 98.0 pulse 80 respiratory rate 25 blood pressure is 120/76 saturations are 94%  Ventilator Settings on 4 L oxygen  . General: Comfortable at this time . Eyes: Grossly normal lids, irises & conjunctiva . ENT: grossly tongue is normal . Neck: no obvious mass . Cardiovascular: S1 S2 normal no gallop . Respiratory: No rhonchi no rales are noted at this time . Abdomen: soft . Skin: no rash seen on limited exam . Musculoskeletal: not rigid . Psychiatric:unable to assess . Neurologic: no seizure no involuntary movements         Lab Data:   Basic Metabolic Panel: Recent Labs  Lab 06/26/19 0818 06/27/19 0459 06/30/19 0741 07/01/19 0709 07/02/19 0645  NA 136  --  137  --  136  K 3.4* 4.2 3.2* 3.7 3.4*  CL 90*  --  94*  --  92*  CO2 32  --  33*  --  33*  GLUCOSE 129*  --  114*  --  126*  BUN 15  --  23*  --  20  CREATININE 0.93  --  0.89  --  0.82  CALCIUM 10.7*  --  10.3  --  10.3  MG  --   --  1.9  --  2.1  PHOS  --   --   --   --  3.0    ABG: No results for input(s): PHART, PCO2ART, PO2ART, HCO3, O2SAT in the last 168 hours.  Liver Function Tests: Recent Labs  Lab 06/26/19 0818  AST 24  ALT 22  ALKPHOS 80  BILITOT 0.9  PROT 7.4  ALBUMIN 3.4*   No results for input(s): LIPASE, AMYLASE in the last 168 hours. No results for input(s): AMMONIA in the last 168 hours.  CBC: Recent Labs  Lab 06/26/19 0818 06/30/19 0741  07/02/19 0645  WBC 9.9 14.6* 14.3*  HGB 12.4 11.9* 12.0  HCT 38.7 36.7 37.1  MCV 98.7 100.0 98.4  PLT 313 374 335    Cardiac Enzymes: No results for input(s): CKTOTAL, CKMB, CKMBINDEX, TROPONINI in the last 168 hours.  BNP (last 3 results) No results for input(s): BNP in the last 8760 hours.  ProBNP (last 3 results) No results for input(s): PROBNP in the last 8760 hours.  Radiological Exams: No results found.  Assessment/Plan Active Problems:   Acute on chronic respiratory failure with hypoxia (HCC)   COVID-19 virus infection   Acute respiratory distress syndrome (ARDS) due to COVID-19 virus (HCC)   Healthcare-associated pneumonia   Chronic diastolic heart failure (HCC)   Morbid obesity due to excess calories (HCC)   1. Acute on chronic respiratory failure with hypoxia plan is to continue with oxygen therapy as ordered continue secretion management pulmonary toilet 2. COVID-19 virus infection treated we will continue with supportive care 3. ARDS at baseline 4. Chronic diastolic heart failure compensated 5. Healthcare associated pneumonia improving 6. Morbid obesity we will continue with supportive care dietary management   I  have personally seen and evaluated the patient, evaluated laboratory and imaging results, formulated the assessment and plan and placed orders. The Patient requires high complexity decision making with multiple systems involvement.  Rounds were done with the Respiratory Therapy Director and Staff therapists and discussed with nursing staff also.  Allyne Gee, MD [facility] Pulmonary Critical Care Medicine Sleep Medicine

## 2019-07-03 DIAGNOSIS — J9621 Acute and chronic respiratory failure with hypoxia: Secondary | ICD-10-CM | POA: Diagnosis not present

## 2019-07-03 DIAGNOSIS — I5032 Chronic diastolic (congestive) heart failure: Secondary | ICD-10-CM | POA: Diagnosis not present

## 2019-07-03 DIAGNOSIS — J189 Pneumonia, unspecified organism: Secondary | ICD-10-CM | POA: Diagnosis not present

## 2019-07-03 DIAGNOSIS — U071 COVID-19: Secondary | ICD-10-CM | POA: Diagnosis not present

## 2019-07-03 LAB — POTASSIUM: Potassium: 3.6 mmol/L (ref 3.5–5.1)

## 2019-07-03 NOTE — Progress Notes (Signed)
Pulmonary Critical Care Medicine [facility] [place]   PULMONARY CRITICAL CARE SERVICE  PROGRESS NOTE  Date of Service: 07/03/2019  Kathy Torres  VOZ:[id number]  DOB: 1961-01-09   DOA: 06/25/2019  Referring Physician: Carron Curie, MD  HPI: Kathy Torres is a 59 y.o. female seen for follow up of Acute on Chronic Respiratory Failure.  Patient is doing well on 4 L oxygen right now is on nasal cannula good saturations are noted  Medications: Reviewed on Rounds  Physical Exam:  Vitals: Temperature is 97.8 pulse 65 respiratory rate 20 blood pressure is 127/84 saturations 96%  Ventilator Settings on the nasal cannula with 4 L flow rate  . General: Comfortable at this time . Eyes: Grossly normal lids, irises & conjunctiva . ENT: grossly tongue is normal . Neck: no obvious mass . Cardiovascular: S1 S2 normal no gallop . Respiratory: Scattered rhonchi expansion is equal . Abdomen: soft . Skin: no rash seen on limited exam . Musculoskeletal: not rigid . Psychiatric:unable to assess . Neurologic: no seizure no involuntary movements         Lab Data:   Basic Metabolic Panel: Recent Labs  Lab 06/27/19 0459 06/30/19 0741 07/01/19 0709 07/02/19 0645 07/03/19 0550  NA  --  137  --  136  --   K 4.2 3.2* 3.7 3.4* 3.6  CL  --  94*  --  92*  --   CO2  --  33*  --  33*  --   GLUCOSE  --  114*  --  126*  --   BUN  --  23*  --  20  --   CREATININE  --  0.89  --  0.82  --   CALCIUM  --  10.3  --  10.3  --   MG  --  1.9  --  2.1  --   PHOS  --   --   --  3.0  --     ABG: No results for input(s): PHART, PCO2ART, PO2ART, HCO3, O2SAT in the last 168 hours.  Liver Function Tests: No results for input(s): AST, ALT, ALKPHOS, BILITOT, PROT, ALBUMIN in the last 168 hours. No results for input(s): LIPASE, AMYLASE in the last 168 hours. No results for input(s): AMMONIA in the last 168 hours.  CBC: Recent Labs  Lab 06/30/19 0741 07/02/19 0645  WBC 14.6* 14.3*  HGB 11.9*  12.0  HCT 36.7 37.1  MCV 100.0 98.4  PLT 374 335    Cardiac Enzymes: No results for input(s): CKTOTAL, CKMB, CKMBINDEX, TROPONINI in the last 168 hours.  BNP (last 3 results) No results for input(s): BNP in the last 8760 hours.  ProBNP (last 3 results) No results for input(s): PROBNP in the last 8760 hours.  Radiological Exams: No results found.  Assessment/Plan Active Problems:   Acute on chronic respiratory failure with hypoxia (HCC)   COVID-19 virus infection   Acute respiratory distress syndrome (ARDS) due to COVID-19 virus (HCC)   Healthcare-associated pneumonia   Chronic diastolic heart failure (HCC)   Morbid obesity due to excess calories (HCC)   1. Acute on chronic respiratory failure with hypoxia patient slowly improving with oxygen requirements are still is on 4 L of O2 will titrate down as tolerated 2. COVID-19 virus infection treated clinically improving 3. ARDS advanced disease we will continue with supportive care 4. Healthcare associated pneumonia treated 5. Chronic diastolic heart failure compensated 6. Morbid obesity no change we will continue with present management   I  have personally seen and evaluated the patient, evaluated laboratory and imaging results, formulated the assessment and plan and placed orders. The Patient requires high complexity decision making with multiple systems involvement.  Rounds were done with the Respiratory Therapy Director and Staff therapists and discussed with nursing staff also.  Allyne Gee, MD [facility] Pulmonary Critical Care Medicine Sleep Medicine

## 2019-07-04 DIAGNOSIS — J9621 Acute and chronic respiratory failure with hypoxia: Secondary | ICD-10-CM | POA: Diagnosis not present

## 2019-07-04 DIAGNOSIS — J189 Pneumonia, unspecified organism: Secondary | ICD-10-CM | POA: Diagnosis not present

## 2019-07-04 DIAGNOSIS — I5032 Chronic diastolic (congestive) heart failure: Secondary | ICD-10-CM | POA: Diagnosis not present

## 2019-07-04 DIAGNOSIS — U071 COVID-19: Secondary | ICD-10-CM | POA: Diagnosis not present

## 2019-07-04 LAB — CBC
HCT: 35.5 % — ABNORMAL LOW (ref 36.0–46.0)
Hemoglobin: 11.5 g/dL — ABNORMAL LOW (ref 12.0–15.0)
MCH: 32.3 pg (ref 26.0–34.0)
MCHC: 32.4 g/dL (ref 30.0–36.0)
MCV: 99.7 fL (ref 80.0–100.0)
Platelets: 307 10*3/uL (ref 150–400)
RBC: 3.56 MIL/uL — ABNORMAL LOW (ref 3.87–5.11)
RDW: 15.3 % (ref 11.5–15.5)
WBC: 13.2 10*3/uL — ABNORMAL HIGH (ref 4.0–10.5)
nRBC: 0 % (ref 0.0–0.2)

## 2019-07-04 LAB — PHOSPHORUS: Phosphorus: 3.2 mg/dL (ref 2.5–4.6)

## 2019-07-04 LAB — MAGNESIUM: Magnesium: 2 mg/dL (ref 1.7–2.4)

## 2019-07-04 LAB — BASIC METABOLIC PANEL
Anion gap: 5 (ref 5–15)
BUN: 21 mg/dL — ABNORMAL HIGH (ref 6–20)
CO2: 34 mmol/L — ABNORMAL HIGH (ref 22–32)
Calcium: 9.9 mg/dL (ref 8.9–10.3)
Chloride: 100 mmol/L (ref 98–111)
Creatinine, Ser: 1.01 mg/dL — ABNORMAL HIGH (ref 0.44–1.00)
GFR calc Af Amer: >60 mL/min (ref >60)
GFR calc non Af Amer: >60 mL/min (ref >60)
Glucose, Bld: 125 mg/dL — ABNORMAL HIGH (ref 70–99)
Potassium: 3.7 mmol/L (ref 3.5–5.1)
Sodium: 139 mmol/L (ref 135–145)

## 2019-07-04 NOTE — Progress Notes (Signed)
Pulmonary Critical Care Medicine [facility] [place]   PULMONARY CRITICAL CARE SERVICE  PROGRESS NOTE  Date of Service: 07/04/2019  Kathy Torres  [id number]  DOB: [birth year]   DOA: 06/25/2019  Referring Physician: Carron Curie, MD  HPI: Kathy Torres is a 59 y.o. female seen for follow up of Acute on Chronic Respiratory Failure.  Patient currently is on 4 L of oxygen still with desaturations with activity baseline saturations running about 92%  Medications: Reviewed on Rounds  Physical Exam:  Vitals: Temperature is 96.9 pulse 84 respiratory rate 20 blood pressure is 125/73 saturations 92%  Ventilator Settings on 4 L off the ventilator  . General: Comfortable at this time . Eyes: Grossly normal lids, irises & conjunctiva . ENT: grossly tongue is normal . Neck: no obvious mass . Cardiovascular: S1 S2 normal no gallop . Respiratory: No rhonchi no rales are noted at this time . Abdomen: soft . Skin: no rash seen on limited exam . Musculoskeletal: not rigid . Psychiatric:unable to assess . Neurologic: no seizure no involuntary movements         Lab Data:   Basic Metabolic Panel: Recent Labs  Lab 06/30/19 0741 07/01/19 0709 07/02/19 0645 07/03/19 0550 07/04/19 0522  NA 137  --  136  --  139  K 3.2* 3.7 3.4* 3.6 3.7  CL 94*  --  92*  --  100  CO2 33*  --  33*  --  34*  GLUCOSE 114*  --  126*  --  125*  BUN 23*  --  20  --  21*  CREATININE 0.89  --  0.82  --  1.01*  CALCIUM 10.3  --  10.3  --  9.9  MG 1.9  --  2.1  --  2.0  PHOS  --   --  3.0  --  3.2    ABG: No results for input(s): PHART, PCO2ART, PO2ART, HCO3, O2SAT in the last 168 hours.  Liver Function Tests: No results for input(s): AST, ALT, ALKPHOS, BILITOT, PROT, ALBUMIN in the last 168 hours. No results for input(s): LIPASE, AMYLASE in the last 168 hours. No results for input(s): AMMONIA in the last 168 hours.  CBC: Recent Labs  Lab 06/30/19 0741 07/02/19 0645 07/04/19 0522  WBC  14.6* 14.3* 13.2*  HGB 11.9* 12.0 11.5*  HCT 36.7 37.1 35.5*  MCV 100.0 98.4 99.7  PLT 374 335 307    Cardiac Enzymes: No results for input(s): CKTOTAL, CKMB, CKMBINDEX, TROPONINI in the last 168 hours.  BNP (last 3 results) No results for input(s): BNP in the last 8760 hours.  ProBNP (last 3 results) No results for input(s): PROBNP in the last 8760 hours.  Radiological Exams: No results found.  Assessment/Plan Active Problems:   Acute on chronic respiratory failure with hypoxia (HCC)   COVID-19 virus infection   Acute respiratory distress syndrome (ARDS) due to COVID-19 virus (HCC)   Healthcare-associated pneumonia   Chronic diastolic heart failure (HCC)   Morbid obesity due to excess calories (HCC)   1. Acute on chronic respiratory failure with hypoxia plan is to continue with titrating oxygen as tolerated saturations are now or at the borderline 2. COVID-19 virus infection resolving 3. ARDS due to COVID-19 slow improvement 4. Healthcare associated pneumonia patient is at baseline follow radiologically 5. Chronic diastolic heart failure compensated 6. Morbid obesity dietary management   I have personally seen and evaluated the patient, evaluated laboratory and imaging results, formulated the assessment and plan and placed  orders. The Patient requires high complexity decision making with multiple systems involvement.  Rounds were done with the Respiratory Therapy Director and Staff therapists and discussed with nursing staff also.  Allyne Gee, MD [facility] Pulmonary Critical Care Medicine Sleep Medicine

## 2019-07-05 DIAGNOSIS — J9621 Acute and chronic respiratory failure with hypoxia: Secondary | ICD-10-CM | POA: Diagnosis not present

## 2019-07-05 DIAGNOSIS — J189 Pneumonia, unspecified organism: Secondary | ICD-10-CM | POA: Diagnosis not present

## 2019-07-05 DIAGNOSIS — I5032 Chronic diastolic (congestive) heart failure: Secondary | ICD-10-CM | POA: Diagnosis not present

## 2019-07-05 DIAGNOSIS — U071 COVID-19: Secondary | ICD-10-CM | POA: Diagnosis not present

## 2019-07-05 NOTE — Progress Notes (Signed)
Pulmonary Critical Care Medicine [facility] [place]   PULMONARY CRITICAL CARE SERVICE  PROGRESS NOTE  Date of Service: 07/05/2019  Kathy Torres  [id number]:[id number]  DOB: 1960-07-06   DOA: 06/25/2019  Referring Physician: Carron Curie, MD  HPI: Kathy Torres is a 59 y.o. female seen for follow up of Acute on Chronic Respiratory Failure.  Patient currently is on 4 L of oxygen good saturations are noted at this time.  Medications: Reviewed on Rounds  Physical Exam:  Vitals: Temperature is 97.0 pulse 72 respiratory rate 21 blood pressure is 118/72 saturations 96%  Ventilator Settings off the ventilator on 4 L oxygen  . General: Comfortable at this time . Eyes: Grossly normal lids, irises & conjunctiva . ENT: grossly tongue is normal . Neck: no obvious mass . Cardiovascular: S1 S2 normal no gallop . Respiratory: No rhonchi coarse breath sounds are noted . Abdomen: soft . Skin: no rash seen on limited exam . Musculoskeletal: not rigid . Psychiatric:unable to assess . Neurologic: no seizure no involuntary movements         Lab Data:   Basic Metabolic Panel: Recent Labs  Lab 06/30/19 0741 07/01/19 0709 07/02/19 0645 07/03/19 0550 07/04/19 0522  NA 137  --  136  --  139  K 3.2* 3.7 3.4* 3.6 3.7  CL 94*  --  92*  --  100  CO2 33*  --  33*  --  34*  GLUCOSE 114*  --  126*  --  125*  BUN 23*  --  20  --  21*  CREATININE 0.89  --  0.82  --  1.01*  CALCIUM 10.3  --  10.3  --  9.9  MG 1.9  --  2.1  --  2.0  PHOS  --   --  3.0  --  3.2    ABG: No results for input(s): PHART, PCO2ART, PO2ART, HCO3, O2SAT in the last 168 hours.  Liver Function Tests: No results for input(s): AST, ALT, ALKPHOS, BILITOT, PROT, ALBUMIN in the last 168 hours. No results for input(s): LIPASE, AMYLASE in the last 168 hours. No results for input(s): AMMONIA in the last 168 hours.  CBC: Recent Labs  Lab 06/30/19 0741 07/02/19 0645 07/04/19 0522  WBC 14.6* 14.3* 13.2*  HGB 11.9*  12.0 11.5*  HCT 36.7 37.1 35.5*  MCV 100.0 98.4 99.7  PLT 374 335 307    Cardiac Enzymes: No results for input(s): CKTOTAL, CKMB, CKMBINDEX, TROPONINI in the last 168 hours.  BNP (last 3 results) No results for input(s): BNP in the last 8760 hours.  ProBNP (last 3 results) No results for input(s): PROBNP in the last 8760 hours.  Radiological Exams: No results found.  Assessment/Plan Active Problems:   Acute on chronic respiratory failure with hypoxia (HCC)   COVID-19 virus infection   Acute respiratory distress syndrome (ARDS) due to COVID-19 virus (HCC)   Healthcare-associated pneumonia   Chronic diastolic heart failure (HCC)   Morbid obesity due to excess calories (HCC)   1. Acute on chronic respiratory failure hypoxia plan is to continue with oxygen therapy as ordered.  Saturations have slowly improved new baseline may very well be 4 L of oxygen supplement 2. COVID-19 virus infection now is in resolution phase 3. ARDS slowly improving we will continue with supportive care 4. Healthcare associated pneumonia clinically resolved 5. Chronic diastolic heart failure compensated monitor fluid status closely 6. Morbid obesity at baseline   I have personally seen and evaluated the patient, evaluated  laboratory and imaging results, formulated the assessment and plan and placed orders. The Patient requires high complexity decision making with multiple systems involvement.  Rounds were done with the Respiratory Therapy Director and Staff therapists and discussed with nursing staff also.  Allyne Gee, MD [facility] Pulmonary Critical Care Medicine Sleep Medicine

## 2019-07-06 DIAGNOSIS — I5032 Chronic diastolic (congestive) heart failure: Secondary | ICD-10-CM | POA: Diagnosis not present

## 2019-07-06 DIAGNOSIS — U071 COVID-19: Secondary | ICD-10-CM | POA: Diagnosis not present

## 2019-07-06 DIAGNOSIS — J9621 Acute and chronic respiratory failure with hypoxia: Secondary | ICD-10-CM | POA: Diagnosis not present

## 2019-07-06 DIAGNOSIS — J189 Pneumonia, unspecified organism: Secondary | ICD-10-CM | POA: Diagnosis not present

## 2019-07-06 NOTE — Progress Notes (Addendum)
Pulmonary Critical Care Medicine [facility] [place]   PULMONARY CRITICAL CARE SERVICE  PROGRESS NOTE  Date of Service: 07/06/2019  Kathy Torres  TIR:[id number]  DOB: March 10, 1961   DOA: 06/25/2019  Referring Physician: Carron Curie, MD  HPI: Kathy Torres is a 59 y.o. female seen for follow up of Acute on Chronic Respiratory Failure.  Patient remains on 4 l nasal cannula satting well no fever distress.  Medications: Reviewed on Rounds  Physical Exam:  Vitals: Pulse 69 respirations 21 BP 126/80 O2 sat 96% temp 98.2  Ventilator Settings 4 l nasal cannula  . General: Comfortable at this time . Eyes: Grossly normal lids, irises & conjunctiva . ENT: grossly tongue is normal . Neck: no obvious mass . Cardiovascular: S1 S2 normal no gallop . Respiratory: No rales or rhonchi noted . Abdomen: soft . Skin: no rash seen on limited exam . Musculoskeletal: not rigid . Psychiatric:unable to assess . Neurologic: no seizure no involuntary movements         Lab Data:   Basic Metabolic Panel: Recent Labs  Lab 06/30/19 0741 07/01/19 0709 07/02/19 0645 07/03/19 0550 07/04/19 0522  NA 137  --  136  --  139  K 3.2* 3.7 3.4* 3.6 3.7  CL 94*  --  92*  --  100  CO2 33*  --  33*  --  34*  GLUCOSE 114*  --  126*  --  125*  BUN 23*  --  20  --  21*  CREATININE 0.89  --  0.82  --  1.01*  CALCIUM 10.3  --  10.3  --  9.9  MG 1.9  --  2.1  --  2.0  PHOS  --   --  3.0  --  3.2    ABG: No results for input(s): PHART, PCO2ART, PO2ART, HCO3, O2SAT in the last 168 hours.  Liver Function Tests: No results for input(s): AST, ALT, ALKPHOS, BILITOT, PROT, ALBUMIN in the last 168 hours. No results for input(s): LIPASE, AMYLASE in the last 168 hours. No results for input(s): AMMONIA in the last 168 hours.  CBC: Recent Labs  Lab 06/30/19 0741 07/02/19 0645 07/04/19 0522  WBC 14.6* 14.3* 13.2*  HGB 11.9* 12.0 11.5*  HCT 36.7 37.1 35.5*  MCV 100.0 98.4 99.7  PLT 374 335 307     Cardiac Enzymes: No results for input(s): CKTOTAL, CKMB, CKMBINDEX, TROPONINI in the last 168 hours.  BNP (last 3 results) No results for input(s): BNP in the last 8760 hours.  ProBNP (last 3 results) No results for input(s): PROBNP in the last 8760 hours.  Radiological Exams: No results found.  Assessment/Plan Active Problems:   Acute on chronic respiratory failure with hypoxia (HCC)   COVID-19 virus infection   Acute respiratory distress syndrome (ARDS) due to COVID-19 virus (HCC)   Healthcare-associated pneumonia   Chronic diastolic heart failure (HCC)   Morbid obesity due to excess calories (HCC)   1. Acute on chronic respiratory failure hypoxia continue with nasal cannula on 4 l oxygen.  Continue supportive measures and pulmonary toilet. 2. COVID-19 virus infection now is in resolution phase 3. ARDS slowly improving we will continue with supportive care 4. Healthcare associated pneumonia clinically resolved 5. Chronic diastolic heart failure compensated monitor fluid status closely 6. Morbid obesity at baseline   I have personally seen and evaluated the patient, evaluated laboratory and imaging results, formulated the assessment and plan and placed orders. The Patient requires high complexity decision making with multiple  systems involvement.  Rounds were done with the Respiratory Therapy Director and Staff therapists and discussed with nursing staff also.  Allyne Gee, MD [facility] Pulmonary Critical Care Medicine Sleep Medicine

## 2019-07-07 DIAGNOSIS — I5032 Chronic diastolic (congestive) heart failure: Secondary | ICD-10-CM | POA: Diagnosis not present

## 2019-07-07 DIAGNOSIS — J189 Pneumonia, unspecified organism: Secondary | ICD-10-CM | POA: Diagnosis not present

## 2019-07-07 DIAGNOSIS — U071 COVID-19: Secondary | ICD-10-CM | POA: Diagnosis not present

## 2019-07-07 DIAGNOSIS — J9621 Acute and chronic respiratory failure with hypoxia: Secondary | ICD-10-CM | POA: Diagnosis not present

## 2019-07-07 LAB — BASIC METABOLIC PANEL
Anion gap: 11 (ref 5–15)
BUN: 17 mg/dL (ref 6–20)
CO2: 30 mmol/L (ref 22–32)
Calcium: 9.9 mg/dL (ref 8.9–10.3)
Chloride: 100 mmol/L (ref 98–111)
Creatinine, Ser: 0.82 mg/dL (ref 0.44–1.00)
GFR calc Af Amer: >60 mL/min (ref >60)
GFR calc non Af Amer: >60 mL/min (ref >60)
Glucose, Bld: 122 mg/dL — ABNORMAL HIGH (ref 70–99)
Potassium: 3.6 mmol/L (ref 3.5–5.1)
Sodium: 141 mmol/L (ref 135–145)

## 2019-07-07 LAB — CBC
HCT: 36.6 % (ref 36.0–46.0)
Hemoglobin: 11.4 g/dL — ABNORMAL LOW (ref 12.0–15.0)
MCH: 31.8 pg (ref 26.0–34.0)
MCHC: 31.1 g/dL (ref 30.0–36.0)
MCV: 101.9 fL — ABNORMAL HIGH (ref 80.0–100.0)
Platelets: 266 10*3/uL (ref 150–400)
RBC: 3.59 MIL/uL — ABNORMAL LOW (ref 3.87–5.11)
RDW: 15.8 % — ABNORMAL HIGH (ref 11.5–15.5)
WBC: 12.3 10*3/uL — ABNORMAL HIGH (ref 4.0–10.5)
nRBC: 0 % (ref 0.0–0.2)

## 2019-07-07 LAB — PHOSPHORUS: Phosphorus: 3.4 mg/dL (ref 2.5–4.6)

## 2019-07-07 LAB — MAGNESIUM: Magnesium: 1.8 mg/dL (ref 1.7–2.4)

## 2019-07-07 NOTE — Progress Notes (Addendum)
Pulmonary Critical Care Medicine [facility] [place]   PULMONARY CRITICAL CARE SERVICE  PROGRESS NOTE  Date of Service: 07/07/2019  [name]  NFA:[id number]  DOB: [birth year]   DOA: 06/25/2019  Referring Physician: Carron Curie, MD  HPI: Kathy Torres is a 59 y.o. female seen for follow up of Acute on Chronic Respiratory Failure.  Patient mains on 4 L nasal cannula satting well no fever distress.  Medications: Reviewed on Rounds  Physical Exam:  Vitals: Pulse 70 respirations 16 BP 114/61 O2 sat 95% temp 95.6  Ventilator Settings 4 L nasal cannula  . General: Comfortable at this time . Eyes: Grossly normal lids, irises & conjunctiva . ENT: grossly tongue is normal . Neck: no obvious mass . Cardiovascular: S1 S2 normal no gallop . Respiratory: No rales or rhonchi noted . Abdomen: soft . Skin: no rash seen on limited exam . Musculoskeletal: not rigid . Psychiatric:unable to assess . Neurologic: no seizure no involuntary movements         Lab Data:   Basic Metabolic Panel: Recent Labs  Lab 07/01/19 0709 07/02/19 0645 07/03/19 0550 07/04/19 0522 07/07/19 0435  NA  --  136  --  139 141  K 3.7 3.4* 3.6 3.7 3.6  CL  --  92*  --  100 100  CO2  --  33*  --  34* 30  GLUCOSE  --  126*  --  125* 122*  BUN  --  20  --  21* 17  CREATININE  --  0.82  --  1.01* 0.82  CALCIUM  --  10.3  --  9.9 9.9  MG  --  2.1  --  2.0 1.8  PHOS  --  3.0  --  3.2 3.4    ABG: No results for input(s): PHART, PCO2ART, PO2ART, HCO3, O2SAT in the last 168 hours.  Liver Function Tests: No results for input(s): AST, ALT, ALKPHOS, BILITOT, PROT, ALBUMIN in the last 168 hours. No results for input(s): LIPASE, AMYLASE in the last 168 hours. No results for input(s): AMMONIA in the last 168 hours.  CBC: Recent Labs  Lab 07/02/19 0645 07/04/19 0522 07/07/19 0435  WBC 14.3* 13.2* 12.3*  HGB 12.0 11.5* 11.4*  HCT 37.1 35.5* 36.6  MCV 98.4 99.7 101.9*  PLT 335 307 266     Cardiac Enzymes: No results for input(s): CKTOTAL, CKMB, CKMBINDEX, TROPONINI in the last 168 hours.  BNP (last 3 results) No results for input(s): BNP in the last 8760 hours.  ProBNP (last 3 results) No results for input(s): PROBNP in the last 8760 hours.  Radiological Exams: No results found.  Assessment/Plan Active Problems:   Acute on chronic respiratory failure with hypoxia (HCC)   COVID-19 virus infection   Acute respiratory distress syndrome (ARDS) due to COVID-19 virus (HCC)   Healthcare-associated pneumonia   Chronic diastolic heart failure (HCC)   Morbid obesity due to excess calories (HCC)   1. Acute on chronic respiratory failure hypoxia continue with nasal cannula on 4 l oxygen.  Continue supportive measures and pulmonary toilet. 2. COVID-19 virus infection now is in resolution phase 3. ARDS slowly improving we will continue with supportive care 4. Healthcare associated pneumonia clinically resolved 5. Chronic diastolic heart failure compensated monitor fluid status closely 6. Morbid obesity at baseline   I have personally seen and evaluated the patient, evaluated laboratory and imaging results, formulated the assessment and plan and placed orders. The Patient requires high complexity decision making with multiple systems involvement.  Rounds were done with the Respiratory Therapy Director and Staff therapists and discussed with nursing staff also.  Allyne Gee, MD [facility] Pulmonary Critical Care Medicine Sleep Medicine

## 2019-07-08 DIAGNOSIS — J189 Pneumonia, unspecified organism: Secondary | ICD-10-CM | POA: Diagnosis not present

## 2019-07-08 DIAGNOSIS — J9621 Acute and chronic respiratory failure with hypoxia: Secondary | ICD-10-CM | POA: Diagnosis not present

## 2019-07-08 DIAGNOSIS — U071 COVID-19: Secondary | ICD-10-CM | POA: Diagnosis not present

## 2019-07-08 DIAGNOSIS — I5032 Chronic diastolic (congestive) heart failure: Secondary | ICD-10-CM | POA: Diagnosis not present

## 2019-07-08 NOTE — Progress Notes (Addendum)
Pulmonary Critical Care Medicine [facility] [place]   PULMONARY CRITICAL CARE SERVICE  PROGRESS NOTE  Date of Service: 07/08/2019  Kathy Torres  LFY:[id number]  DOB: [birth year]   DOA: 06/25/2019  Referring Physician: Carron Curie, MD  HPI: Kathy Torres is a 59 y.o. female seen for follow up of Acute on Chronic Respiratory Failure.  Patient currently is on 4 L oxygen awaiting discharge planning which will be probably tomorrow  Medications: Reviewed on Rounds  Physical Exam:  Vitals: Temperature 97.4 pulse 95 respiratory 20 blood pressure is 103/79 saturations 96%  Ventilator Settings on 4 L of O2  . General: Comfortable at this time . Eyes: Grossly normal lids, irises & conjunctiva . ENT: grossly tongue is normal . Neck: no obvious mass . Cardiovascular: S1 S2 normal no gallop . Respiratory: No rhonchi no rales are noted at this time . Abdomen: soft . Skin: no rash seen on limited exam . Musculoskeletal: not rigid . Psychiatric:unable to assess . Neurologic: no seizure no involuntary movements         Lab Data:   Basic Metabolic Panel: Recent Labs  Lab 07/02/19 0645 07/03/19 0550 07/04/19 0522 07/07/19 0435  NA 136  --  139 141  K 3.4* 3.6 3.7 3.6  CL 92*  --  100 100  CO2 33*  --  34* 30  GLUCOSE 126*  --  125* 122*  BUN 20  --  21* 17  CREATININE 0.82  --  1.01* 0.82  CALCIUM 10.3  --  9.9 9.9  MG 2.1  --  2.0 1.8  PHOS 3.0  --  3.2 3.4    ABG: No results for input(s): PHART, PCO2ART, PO2ART, HCO3, O2SAT in the last 168 hours.  Liver Function Tests: No results for input(s): AST, ALT, ALKPHOS, BILITOT, PROT, ALBUMIN in the last 168 hours. No results for input(s): LIPASE, AMYLASE in the last 168 hours. No results for input(s): AMMONIA in the last 168 hours.  CBC: Recent Labs  Lab 07/02/19 0645 07/04/19 0522 07/07/19 0435  WBC 14.3* 13.2* 12.3*  HGB 12.0 11.5* 11.4*  HCT 37.1 35.5* 36.6  MCV 98.4 99.7 101.9*  PLT 335 307 266     Cardiac Enzymes: No results for input(s): CKTOTAL, CKMB, CKMBINDEX, TROPONINI in the last 168 hours.  BNP (last 3 results) No results for input(s): BNP in the last 8760 hours.  ProBNP (last 3 results) No results for input(s): PROBNP in the last 8760 hours.  Radiological Exams: No results found.  Assessment/Plan Active Problems:   Acute on chronic respiratory failure with hypoxia (HCC)   COVID-19 virus infection   Acute respiratory distress syndrome (ARDS) due to COVID-19 virus (HCC)   Healthcare-associated pneumonia   Chronic diastolic heart failure (HCC)   Morbid obesity due to excess calories (HCC)   1. Acute on chronic respiratory failure with hypoxia patient is going to continue with oxygen at 4 L plan will be discharging in the morning 2. COVID-19 virus infection treated we will continue with supportive care 3. ARDS treated improving 4. Healthcare associated pneumonia follow-up radiologically 5. Chronic diastolic heart failure compensated 6. Morbid obesity at baseline   I have personally seen and evaluated the patient, evaluated laboratory and imaging results, formulated the assessment and plan and placed orders. The Patient requires high complexity decision making with multiple systems involvement.  Rounds were done with the Respiratory Therapy Director and Staff therapists and discussed with nursing staff also.  Yevonne Pax, MD [facility] Pulmonary Critical  Care Medicine Sleep Medicine

## 2019-07-09 LAB — CBC
HCT: 37.3 % (ref 36.0–46.0)
Hemoglobin: 11.8 g/dL — ABNORMAL LOW (ref 12.0–15.0)
MCH: 32.1 pg (ref 26.0–34.0)
MCHC: 31.6 g/dL (ref 30.0–36.0)
MCV: 101.4 fL — ABNORMAL HIGH (ref 80.0–100.0)
Platelets: 312 10*3/uL (ref 150–400)
RBC: 3.68 MIL/uL — ABNORMAL LOW (ref 3.87–5.11)
RDW: 16.4 % — ABNORMAL HIGH (ref 11.5–15.5)
WBC: 12.5 10*3/uL — ABNORMAL HIGH (ref 4.0–10.5)
nRBC: 0 % (ref 0.0–0.2)

## 2021-11-04 IMAGING — DX DG CHEST 1V PORT
1 series · 1 of 1 positions shown · non-contrast
Comparison: None.

CLINICAL DATA: Respiratory failure

EXAM:
PORTABLE CHEST 1 VIEW

[chest ap]
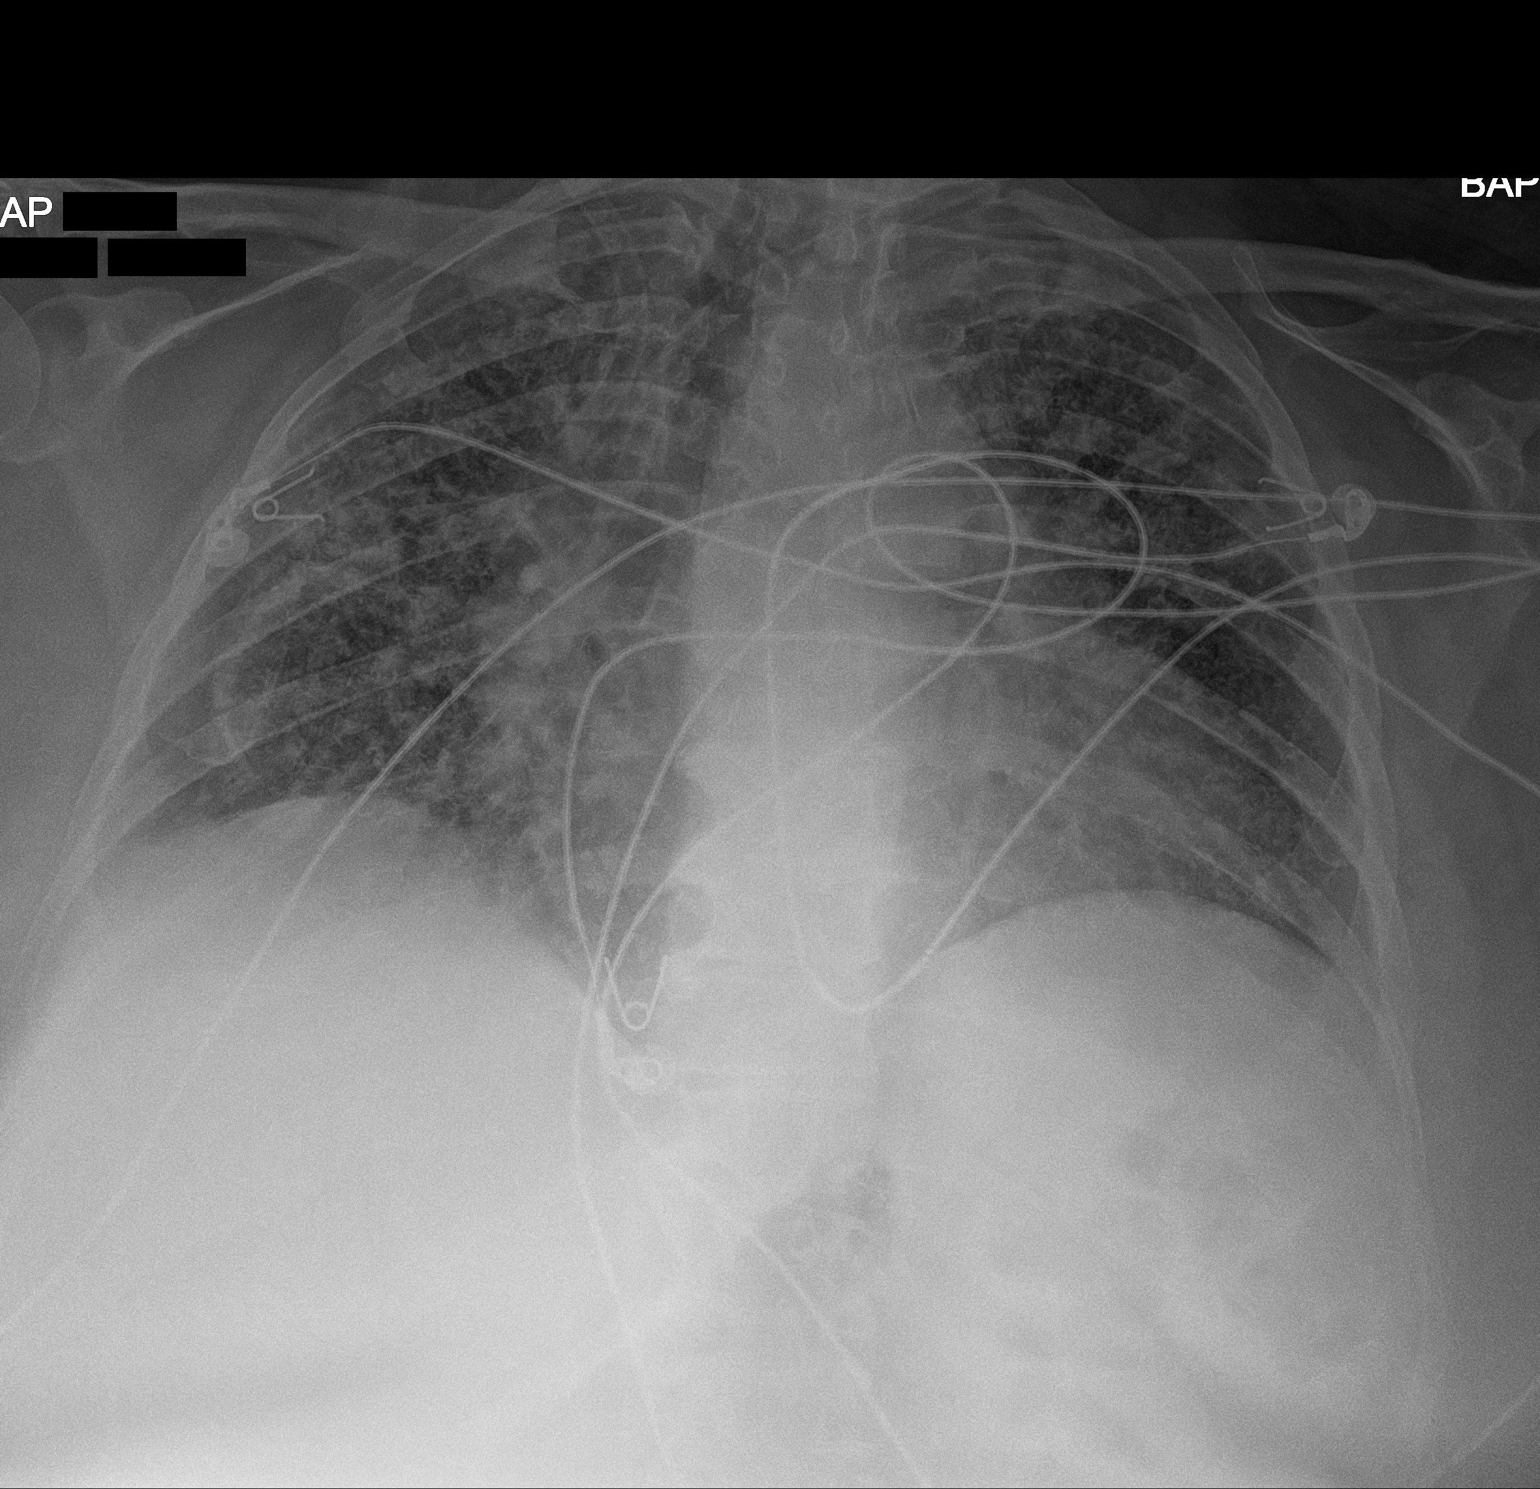

[1 of 1 positions shown; findings below may reference images not displayed]

FINDINGS: Cardiomegaly. Diffuse bilateral airspace disease. Low lung volumes.
No effusions or acute bony abnormality.
IMPRESSION: Diffuse bilateral airspace disease with low volumes. This could
reflect edema or infection.
